# Patient Record
Sex: Female | Born: 1935 | Race: White | Hispanic: No | Marital: Single
Health system: Southern US, Community
[De-identification: ages and names within clinical notes are randomized; demographics above are authoritative.]

---

## 2004-09-25 ENCOUNTER — Ambulatory Visit: Payer: Self-pay | Admitting: Unknown Physician Specialty

## 2005-04-02 ENCOUNTER — Ambulatory Visit: Payer: Self-pay | Admitting: Unknown Physician Specialty

## 2005-09-29 ENCOUNTER — Ambulatory Visit: Payer: Self-pay | Admitting: Unknown Physician Specialty

## 2005-10-07 ENCOUNTER — Ambulatory Visit: Payer: Self-pay | Admitting: Unknown Physician Specialty

## 2006-10-13 ENCOUNTER — Ambulatory Visit: Payer: Self-pay | Admitting: Unknown Physician Specialty

## 2006-10-19 ENCOUNTER — Ambulatory Visit: Payer: Self-pay | Admitting: Unknown Physician Specialty

## 2007-04-26 ENCOUNTER — Ambulatory Visit: Payer: Self-pay | Admitting: Unknown Physician Specialty

## 2007-10-14 ENCOUNTER — Ambulatory Visit: Payer: Self-pay | Admitting: Surgery

## 2008-01-04 ENCOUNTER — Ambulatory Visit: Payer: Self-pay | Admitting: Unknown Physician Specialty

## 2008-01-12 ENCOUNTER — Ambulatory Visit: Payer: Self-pay | Admitting: Surgery

## 2008-01-18 ENCOUNTER — Ambulatory Visit: Payer: Self-pay | Admitting: Surgery

## 2008-10-17 ENCOUNTER — Ambulatory Visit: Payer: Self-pay | Admitting: Unknown Physician Specialty

## 2009-10-22 ENCOUNTER — Ambulatory Visit: Payer: Self-pay | Admitting: Unknown Physician Specialty

## 2010-10-24 ENCOUNTER — Ambulatory Visit: Payer: Self-pay | Admitting: Unknown Physician Specialty

## 2010-11-05 ENCOUNTER — Ambulatory Visit: Payer: Self-pay | Admitting: Internal Medicine

## 2011-10-30 ENCOUNTER — Ambulatory Visit: Payer: Self-pay | Admitting: Unknown Physician Specialty

## 2012-11-01 ENCOUNTER — Ambulatory Visit: Payer: Self-pay | Admitting: Unknown Physician Specialty

## 2013-03-31 ENCOUNTER — Ambulatory Visit: Payer: Self-pay | Admitting: Orthopedic Surgery

## 2013-04-05 ENCOUNTER — Ambulatory Visit: Payer: Self-pay | Admitting: Orthopedic Surgery

## 2013-04-05 LAB — BASIC METABOLIC PANEL
Calcium, Total: 9.5 mg/dL (ref 8.5–10.1)
Creatinine: 0.92 mg/dL (ref 0.60–1.30)
EGFR (Non-African Amer.): 58 — ABNORMAL LOW
Glucose: 150 mg/dL — ABNORMAL HIGH (ref 65–99)
Sodium: 131 mmol/L — ABNORMAL LOW (ref 136–145)

## 2013-04-05 LAB — HEMOGLOBIN: HGB: 13.3 g/dL

## 2013-04-07 ENCOUNTER — Ambulatory Visit: Payer: Self-pay | Admitting: Orthopedic Surgery

## 2013-11-02 ENCOUNTER — Ambulatory Visit: Payer: Self-pay | Admitting: Internal Medicine

## 2014-10-13 ENCOUNTER — Other Ambulatory Visit: Payer: Self-pay | Admitting: Internal Medicine

## 2014-10-13 DIAGNOSIS — Z1231 Encounter for screening mammogram for malignant neoplasm of breast: Secondary | ICD-10-CM

## 2014-10-20 NOTE — Op Note (Signed)
PATIENT NAME:  Ileana RoupCOBB, Shima A MR#:  [id number] DATE OF BIRTH:  [birth year]  DATE OF PROCEDURE:  04/07/2013  PREOPERATIVE DIAGNOSIS: L1 compression fracture.   POSTOPERATIVE DIAGNOSIS: L1 compression fracture.   PROCEDURE: L1 biopsy and kyphoplasty.   ANESTHESIA: MAC.   SURGEON: Kennedy BuckerMichael Herschell Virani, M.D.   DESCRIPTION OF PROCEDURE: The patient was brought to the operating room and after adequate anesthesia was obtained the patient was placed prone. C-arm was brought in and good visualization of the L1 vertebral body was obtained in both AP and lateral projections. After time out procedure was completed. The skin was prepped with alcohol and 5 mL of 1% Xylocaine was infiltrated on either side subcutaneously for initial analgesia for this subsequent procedure. The back was then prepped and draped in the usual sterile fashion and a repeat timeout procedure completed. Spinal needle was then inserted through the previously anesthetized with local anesthetic down to the pedicle and a combination of 0.5% Sensorcaine with epinephrine and 1% Xylocaine was infiltrated along the tract with 20 mL given to both sides, a small stab incision was then made on the right side and a trocar advanced into the middle of the body.  It  was across the midline and so only one side needed to be addressed. A biopsy was obtained at this time. Drilling was then carried out and a good correction of the deformity was obtained with approximately 5 mL inflation of a balloon. The cement was then mixed and approximately 5 mL inserted. This gave very good fill to the vertebral body and significant correction of the preop deformity. The trocar was removed and permanent C-arm views were obtained. Dermabond was used for the skin incision followed by a Band-Aid. The patient was sent to the recovery room in stable condition and this.   ESTIMATED BLOOD LOSS: Minimal.   COMPLICATIONS: None.   SPECIMEN: L1 vertebral body biopsy.    ____________________________ Leitha SchullerMichael J. Millee Denise, MD mjm:cc D: 04/07/2013 18:56:58 ET T: 04/07/2013 22:36:35 ET JOB#: [id number]  cc: Leitha SchullerMichael J. Shella Lahman, MD, <Dictator> Leitha SchullerMICHAEL J Reeva Davern MD ELECTRONICALLY SIGNED 04/08/2013 7:59

## 2014-11-07 ENCOUNTER — Ambulatory Visit
Admission: RE | Admit: 2014-11-07 | Discharge: 2014-11-07 | Disposition: A | Discharge status: Home / Self Care | Payer: Commercial Managed Care - HMO | Source: Ambulatory Visit | Attending: Internal Medicine | Admitting: Internal Medicine

## 2014-11-07 ENCOUNTER — Other Ambulatory Visit: Payer: Self-pay | Admitting: Internal Medicine

## 2014-11-07 DIAGNOSIS — Z1231 Encounter for screening mammogram for malignant neoplasm of breast: Secondary | ICD-10-CM | POA: Insufficient documentation

## 2015-10-01 DIAGNOSIS — I10 Essential (primary) hypertension: Secondary | ICD-10-CM | POA: Diagnosis not present

## 2015-10-01 DIAGNOSIS — E782 Mixed hyperlipidemia: Secondary | ICD-10-CM | POA: Diagnosis not present

## 2015-10-01 DIAGNOSIS — E1129 Type 2 diabetes mellitus with other diabetic kidney complication: Secondary | ICD-10-CM | POA: Diagnosis not present

## 2015-10-01 DIAGNOSIS — R809 Proteinuria, unspecified: Secondary | ICD-10-CM | POA: Diagnosis not present

## 2015-10-05 ENCOUNTER — Other Ambulatory Visit: Payer: Self-pay | Admitting: Internal Medicine

## 2015-10-05 DIAGNOSIS — Z1231 Encounter for screening mammogram for malignant neoplasm of breast: Secondary | ICD-10-CM

## 2015-10-08 DIAGNOSIS — R300 Dysuria: Secondary | ICD-10-CM | POA: Diagnosis not present

## 2015-10-08 DIAGNOSIS — E1129 Type 2 diabetes mellitus with other diabetic kidney complication: Secondary | ICD-10-CM | POA: Diagnosis not present

## 2015-10-08 DIAGNOSIS — Z1239 Encounter for other screening for malignant neoplasm of breast: Secondary | ICD-10-CM | POA: Diagnosis not present

## 2015-10-08 DIAGNOSIS — R809 Proteinuria, unspecified: Secondary | ICD-10-CM | POA: Diagnosis not present

## 2015-10-08 DIAGNOSIS — N760 Acute vaginitis: Secondary | ICD-10-CM | POA: Diagnosis not present

## 2015-10-08 DIAGNOSIS — Z0000 Encounter for general adult medical examination without abnormal findings: Secondary | ICD-10-CM | POA: Diagnosis not present

## 2015-11-08 ENCOUNTER — Other Ambulatory Visit: Payer: Self-pay | Admitting: Internal Medicine

## 2015-11-08 ENCOUNTER — Ambulatory Visit
Admission: RE | Admit: 2015-11-08 | Discharge: 2015-11-08 | Disposition: A | Discharge status: Home / Self Care | Payer: PPO | Source: Ambulatory Visit | Attending: Internal Medicine | Admitting: Internal Medicine

## 2015-11-08 DIAGNOSIS — Z1231 Encounter for screening mammogram for malignant neoplasm of breast: Secondary | ICD-10-CM

## 2016-01-02 DIAGNOSIS — R809 Proteinuria, unspecified: Secondary | ICD-10-CM | POA: Diagnosis not present

## 2016-01-02 DIAGNOSIS — E1129 Type 2 diabetes mellitus with other diabetic kidney complication: Secondary | ICD-10-CM | POA: Diagnosis not present

## 2016-01-09 DIAGNOSIS — I10 Essential (primary) hypertension: Secondary | ICD-10-CM | POA: Diagnosis not present

## 2016-01-09 DIAGNOSIS — Z8639 Personal history of other endocrine, nutritional and metabolic disease: Secondary | ICD-10-CM | POA: Diagnosis not present

## 2016-01-09 DIAGNOSIS — E782 Mixed hyperlipidemia: Secondary | ICD-10-CM | POA: Diagnosis not present

## 2016-01-09 DIAGNOSIS — K219 Gastro-esophageal reflux disease without esophagitis: Secondary | ICD-10-CM | POA: Diagnosis not present

## 2016-01-09 DIAGNOSIS — R809 Proteinuria, unspecified: Secondary | ICD-10-CM | POA: Diagnosis not present

## 2016-01-09 DIAGNOSIS — E1129 Type 2 diabetes mellitus with other diabetic kidney complication: Secondary | ICD-10-CM | POA: Diagnosis not present

## 2016-01-16 DIAGNOSIS — E119 Type 2 diabetes mellitus without complications: Secondary | ICD-10-CM | POA: Diagnosis not present

## 2016-07-07 DIAGNOSIS — E782 Mixed hyperlipidemia: Secondary | ICD-10-CM | POA: Diagnosis not present

## 2016-07-07 DIAGNOSIS — Z8639 Personal history of other endocrine, nutritional and metabolic disease: Secondary | ICD-10-CM | POA: Diagnosis not present

## 2016-07-07 DIAGNOSIS — R809 Proteinuria, unspecified: Secondary | ICD-10-CM | POA: Diagnosis not present

## 2016-07-07 DIAGNOSIS — E1129 Type 2 diabetes mellitus with other diabetic kidney complication: Secondary | ICD-10-CM | POA: Diagnosis not present

## 2016-07-11 DIAGNOSIS — R809 Proteinuria, unspecified: Secondary | ICD-10-CM | POA: Diagnosis not present

## 2016-07-11 DIAGNOSIS — B351 Tinea unguium: Secondary | ICD-10-CM | POA: Diagnosis not present

## 2016-07-11 DIAGNOSIS — E7800 Pure hypercholesterolemia, unspecified: Secondary | ICD-10-CM | POA: Diagnosis not present

## 2016-07-11 DIAGNOSIS — I10 Essential (primary) hypertension: Secondary | ICD-10-CM | POA: Diagnosis not present

## 2016-07-11 DIAGNOSIS — E1129 Type 2 diabetes mellitus with other diabetic kidney complication: Secondary | ICD-10-CM | POA: Diagnosis not present

## 2016-10-06 ENCOUNTER — Other Ambulatory Visit: Payer: Self-pay | Admitting: Internal Medicine

## 2016-10-06 DIAGNOSIS — Z1231 Encounter for screening mammogram for malignant neoplasm of breast: Secondary | ICD-10-CM

## 2016-10-09 DIAGNOSIS — I10 Essential (primary) hypertension: Secondary | ICD-10-CM | POA: Diagnosis not present

## 2016-10-09 DIAGNOSIS — Z23 Encounter for immunization: Secondary | ICD-10-CM | POA: Diagnosis not present

## 2016-10-09 DIAGNOSIS — Z0000 Encounter for general adult medical examination without abnormal findings: Secondary | ICD-10-CM | POA: Diagnosis not present

## 2016-10-09 DIAGNOSIS — Z780 Asymptomatic menopausal state: Secondary | ICD-10-CM | POA: Diagnosis not present

## 2016-10-09 DIAGNOSIS — A084 Viral intestinal infection, unspecified: Secondary | ICD-10-CM | POA: Diagnosis not present

## 2016-10-09 DIAGNOSIS — J301 Allergic rhinitis due to pollen: Secondary | ICD-10-CM | POA: Diagnosis not present

## 2016-10-17 DIAGNOSIS — M8588 Other specified disorders of bone density and structure, other site: Secondary | ICD-10-CM | POA: Diagnosis not present

## 2016-11-10 ENCOUNTER — Ambulatory Visit
Admission: RE | Admit: 2016-11-10 | Discharge: 2016-11-10 | Disposition: A | Discharge status: Home / Self Care | Payer: PPO | Source: Ambulatory Visit | Attending: Internal Medicine | Admitting: Internal Medicine

## 2016-11-10 DIAGNOSIS — Z1231 Encounter for screening mammogram for malignant neoplasm of breast: Secondary | ICD-10-CM

## 2017-01-07 DIAGNOSIS — I10 Essential (primary) hypertension: Secondary | ICD-10-CM | POA: Diagnosis not present

## 2017-01-14 DIAGNOSIS — G4762 Sleep related leg cramps: Secondary | ICD-10-CM | POA: Diagnosis not present

## 2017-01-14 DIAGNOSIS — R350 Frequency of micturition: Secondary | ICD-10-CM | POA: Diagnosis not present

## 2017-01-14 DIAGNOSIS — M25472 Effusion, left ankle: Secondary | ICD-10-CM | POA: Diagnosis not present

## 2017-01-14 DIAGNOSIS — E1129 Type 2 diabetes mellitus with other diabetic kidney complication: Secondary | ICD-10-CM | POA: Diagnosis not present

## 2017-01-14 DIAGNOSIS — R809 Proteinuria, unspecified: Secondary | ICD-10-CM | POA: Diagnosis not present

## 2017-01-14 DIAGNOSIS — I10 Essential (primary) hypertension: Secondary | ICD-10-CM | POA: Diagnosis not present

## 2017-01-14 DIAGNOSIS — M25471 Effusion, right ankle: Secondary | ICD-10-CM | POA: Diagnosis not present

## 2017-04-29 DIAGNOSIS — K219 Gastro-esophageal reflux disease without esophagitis: Secondary | ICD-10-CM | POA: Diagnosis not present

## 2017-04-29 DIAGNOSIS — R809 Proteinuria, unspecified: Secondary | ICD-10-CM | POA: Diagnosis not present

## 2017-04-29 DIAGNOSIS — E1129 Type 2 diabetes mellitus with other diabetic kidney complication: Secondary | ICD-10-CM | POA: Diagnosis not present

## 2017-04-29 DIAGNOSIS — I10 Essential (primary) hypertension: Secondary | ICD-10-CM | POA: Diagnosis not present

## 2017-07-24 DIAGNOSIS — R809 Proteinuria, unspecified: Secondary | ICD-10-CM | POA: Diagnosis not present

## 2017-07-24 DIAGNOSIS — I10 Essential (primary) hypertension: Secondary | ICD-10-CM | POA: Diagnosis not present

## 2017-07-24 DIAGNOSIS — E1129 Type 2 diabetes mellitus with other diabetic kidney complication: Secondary | ICD-10-CM | POA: Diagnosis not present

## 2017-07-30 DIAGNOSIS — E1122 Type 2 diabetes mellitus with diabetic chronic kidney disease: Secondary | ICD-10-CM | POA: Diagnosis not present

## 2017-07-30 DIAGNOSIS — N3000 Acute cystitis without hematuria: Secondary | ICD-10-CM | POA: Diagnosis not present

## 2017-07-30 DIAGNOSIS — N183 Chronic kidney disease, stage 3 (moderate): Secondary | ICD-10-CM | POA: Diagnosis not present

## 2017-07-30 DIAGNOSIS — I10 Essential (primary) hypertension: Secondary | ICD-10-CM | POA: Diagnosis not present

## 2017-10-05 DIAGNOSIS — S93402A Sprain of unspecified ligament of left ankle, initial encounter: Secondary | ICD-10-CM | POA: Diagnosis not present

## 2017-10-05 DIAGNOSIS — J301 Allergic rhinitis due to pollen: Secondary | ICD-10-CM | POA: Diagnosis not present

## 2017-10-08 DIAGNOSIS — M25572 Pain in left ankle and joints of left foot: Secondary | ICD-10-CM | POA: Diagnosis not present

## 2017-10-08 DIAGNOSIS — S8012XA Contusion of left lower leg, initial encounter: Secondary | ICD-10-CM | POA: Diagnosis not present

## 2017-10-08 DIAGNOSIS — E1140 Type 2 diabetes mellitus with diabetic neuropathy, unspecified: Secondary | ICD-10-CM | POA: Diagnosis not present

## 2017-10-08 DIAGNOSIS — B351 Tinea unguium: Secondary | ICD-10-CM | POA: Diagnosis not present

## 2017-10-08 DIAGNOSIS — L851 Acquired keratosis [keratoderma] palmaris et plantaris: Secondary | ICD-10-CM | POA: Diagnosis not present

## 2017-10-12 ENCOUNTER — Other Ambulatory Visit: Payer: Self-pay | Admitting: Internal Medicine

## 2017-10-12 DIAGNOSIS — Z1231 Encounter for screening mammogram for malignant neoplasm of breast: Secondary | ICD-10-CM

## 2017-10-20 DIAGNOSIS — E1122 Type 2 diabetes mellitus with diabetic chronic kidney disease: Secondary | ICD-10-CM | POA: Diagnosis not present

## 2017-10-20 DIAGNOSIS — N183 Chronic kidney disease, stage 3 (moderate): Secondary | ICD-10-CM | POA: Diagnosis not present

## 2017-10-26 DIAGNOSIS — E875 Hyperkalemia: Secondary | ICD-10-CM | POA: Diagnosis not present

## 2017-10-26 DIAGNOSIS — N183 Chronic kidney disease, stage 3 (moderate): Secondary | ICD-10-CM | POA: Diagnosis not present

## 2017-10-26 DIAGNOSIS — Z0000 Encounter for general adult medical examination without abnormal findings: Secondary | ICD-10-CM | POA: Diagnosis not present

## 2017-10-26 DIAGNOSIS — E1122 Type 2 diabetes mellitus with diabetic chronic kidney disease: Secondary | ICD-10-CM | POA: Diagnosis not present

## 2017-12-11 DIAGNOSIS — E875 Hyperkalemia: Secondary | ICD-10-CM | POA: Diagnosis not present

## 2018-01-07 ENCOUNTER — Ambulatory Visit
Admission: RE | Admit: 2018-01-07 | Discharge: 2018-01-07 | Disposition: A | Discharge status: Home / Self Care | Payer: PPO | Source: Ambulatory Visit | Attending: Internal Medicine | Admitting: Internal Medicine

## 2018-01-07 DIAGNOSIS — Z1231 Encounter for screening mammogram for malignant neoplasm of breast: Secondary | ICD-10-CM | POA: Insufficient documentation

## 2018-01-11 ENCOUNTER — Other Ambulatory Visit: Payer: Self-pay | Admitting: Internal Medicine

## 2018-01-11 DIAGNOSIS — R928 Other abnormal and inconclusive findings on diagnostic imaging of breast: Secondary | ICD-10-CM

## 2018-01-11 DIAGNOSIS — N6489 Other specified disorders of breast: Secondary | ICD-10-CM

## 2018-02-01 ENCOUNTER — Ambulatory Visit
Admission: RE | Admit: 2018-02-01 | Discharge: 2018-02-01 | Disposition: A | Discharge status: Home / Self Care | Payer: PPO | Source: Ambulatory Visit | Attending: Internal Medicine | Admitting: Internal Medicine

## 2018-02-01 DIAGNOSIS — R922 Inconclusive mammogram: Secondary | ICD-10-CM | POA: Diagnosis not present

## 2018-02-01 DIAGNOSIS — R928 Other abnormal and inconclusive findings on diagnostic imaging of breast: Secondary | ICD-10-CM | POA: Insufficient documentation

## 2018-02-01 DIAGNOSIS — N6489 Other specified disorders of breast: Secondary | ICD-10-CM

## 2018-03-09 DIAGNOSIS — H2511 Age-related nuclear cataract, right eye: Secondary | ICD-10-CM | POA: Diagnosis not present

## 2018-04-20 DIAGNOSIS — I10 Essential (primary) hypertension: Secondary | ICD-10-CM | POA: Diagnosis not present

## 2018-04-20 DIAGNOSIS — E1121 Type 2 diabetes mellitus with diabetic nephropathy: Secondary | ICD-10-CM | POA: Diagnosis not present

## 2018-04-27 DIAGNOSIS — I10 Essential (primary) hypertension: Secondary | ICD-10-CM | POA: Diagnosis not present

## 2018-04-27 DIAGNOSIS — E1122 Type 2 diabetes mellitus with diabetic chronic kidney disease: Secondary | ICD-10-CM | POA: Diagnosis not present

## 2018-04-27 DIAGNOSIS — N183 Chronic kidney disease, stage 3 (moderate): Secondary | ICD-10-CM | POA: Diagnosis not present

## 2018-04-27 DIAGNOSIS — E7800 Pure hypercholesterolemia, unspecified: Secondary | ICD-10-CM | POA: Diagnosis not present

## 2018-07-22 DIAGNOSIS — E1122 Type 2 diabetes mellitus with diabetic chronic kidney disease: Secondary | ICD-10-CM | POA: Diagnosis not present

## 2018-07-22 DIAGNOSIS — N183 Chronic kidney disease, stage 3 (moderate): Secondary | ICD-10-CM | POA: Diagnosis not present

## 2018-07-29 DIAGNOSIS — N183 Chronic kidney disease, stage 3 (moderate): Secondary | ICD-10-CM | POA: Diagnosis not present

## 2018-07-29 DIAGNOSIS — I10 Essential (primary) hypertension: Secondary | ICD-10-CM | POA: Diagnosis not present

## 2018-07-29 DIAGNOSIS — E1122 Type 2 diabetes mellitus with diabetic chronic kidney disease: Secondary | ICD-10-CM | POA: Diagnosis not present

## 2018-07-29 DIAGNOSIS — E875 Hyperkalemia: Secondary | ICD-10-CM | POA: Diagnosis not present

## 2018-09-08 DIAGNOSIS — I10 Essential (primary) hypertension: Secondary | ICD-10-CM | POA: Diagnosis not present

## 2018-09-08 DIAGNOSIS — E875 Hyperkalemia: Secondary | ICD-10-CM | POA: Diagnosis not present

## 2018-10-25 DIAGNOSIS — R399 Unspecified symptoms and signs involving the genitourinary system: Secondary | ICD-10-CM | POA: Diagnosis not present

## 2018-10-25 DIAGNOSIS — R3915 Urgency of urination: Secondary | ICD-10-CM | POA: Diagnosis not present

## 2018-11-01 DIAGNOSIS — I10 Essential (primary) hypertension: Secondary | ICD-10-CM | POA: Diagnosis not present

## 2018-11-09 DIAGNOSIS — N3000 Acute cystitis without hematuria: Secondary | ICD-10-CM | POA: Diagnosis not present

## 2018-11-09 DIAGNOSIS — N183 Chronic kidney disease, stage 3 (moderate): Secondary | ICD-10-CM | POA: Diagnosis not present

## 2018-11-09 DIAGNOSIS — K219 Gastro-esophageal reflux disease without esophagitis: Secondary | ICD-10-CM | POA: Diagnosis not present

## 2018-11-09 DIAGNOSIS — E1122 Type 2 diabetes mellitus with diabetic chronic kidney disease: Secondary | ICD-10-CM | POA: Diagnosis not present

## 2018-11-09 DIAGNOSIS — M791 Myalgia, unspecified site: Secondary | ICD-10-CM | POA: Diagnosis not present

## 2018-11-16 DIAGNOSIS — R790 Abnormal level of blood mineral: Secondary | ICD-10-CM | POA: Diagnosis not present

## 2018-11-16 DIAGNOSIS — M791 Myalgia, unspecified site: Secondary | ICD-10-CM | POA: Diagnosis not present

## 2018-11-23 DIAGNOSIS — K219 Gastro-esophageal reflux disease without esophagitis: Secondary | ICD-10-CM | POA: Diagnosis not present

## 2018-11-23 DIAGNOSIS — N183 Chronic kidney disease, stage 3 (moderate): Secondary | ICD-10-CM | POA: Diagnosis not present

## 2018-11-23 DIAGNOSIS — R790 Abnormal level of blood mineral: Secondary | ICD-10-CM | POA: Diagnosis not present

## 2018-11-23 DIAGNOSIS — E1122 Type 2 diabetes mellitus with diabetic chronic kidney disease: Secondary | ICD-10-CM | POA: Diagnosis not present

## 2018-11-23 DIAGNOSIS — M791 Myalgia, unspecified site: Secondary | ICD-10-CM | POA: Diagnosis not present

## 2018-11-23 DIAGNOSIS — I10 Essential (primary) hypertension: Secondary | ICD-10-CM | POA: Diagnosis not present

## 2018-11-30 DIAGNOSIS — R790 Abnormal level of blood mineral: Secondary | ICD-10-CM | POA: Diagnosis not present

## 2018-12-14 DIAGNOSIS — M25512 Pain in left shoulder: Secondary | ICD-10-CM | POA: Diagnosis not present

## 2018-12-14 DIAGNOSIS — M19011 Primary osteoarthritis, right shoulder: Secondary | ICD-10-CM | POA: Diagnosis not present

## 2018-12-14 DIAGNOSIS — M19012 Primary osteoarthritis, left shoulder: Secondary | ICD-10-CM | POA: Diagnosis not present

## 2018-12-14 DIAGNOSIS — M542 Cervicalgia: Secondary | ICD-10-CM | POA: Diagnosis not present

## 2018-12-14 DIAGNOSIS — M791 Myalgia, unspecified site: Secondary | ICD-10-CM | POA: Diagnosis not present

## 2018-12-14 DIAGNOSIS — E612 Magnesium deficiency: Secondary | ICD-10-CM | POA: Diagnosis not present

## 2018-12-14 DIAGNOSIS — M4312 Spondylolisthesis, cervical region: Secondary | ICD-10-CM | POA: Diagnosis not present

## 2018-12-14 DIAGNOSIS — M25511 Pain in right shoulder: Secondary | ICD-10-CM | POA: Diagnosis not present

## 2018-12-16 DIAGNOSIS — E875 Hyperkalemia: Secondary | ICD-10-CM | POA: Diagnosis not present

## 2018-12-20 DIAGNOSIS — M353 Polymyalgia rheumatica: Secondary | ICD-10-CM | POA: Diagnosis not present

## 2018-12-20 DIAGNOSIS — Z7952 Long term (current) use of systemic steroids: Secondary | ICD-10-CM | POA: Diagnosis not present

## 2019-01-11 DIAGNOSIS — I10 Essential (primary) hypertension: Secondary | ICD-10-CM | POA: Diagnosis not present

## 2019-01-11 DIAGNOSIS — R600 Localized edema: Secondary | ICD-10-CM | POA: Diagnosis not present

## 2019-01-11 DIAGNOSIS — R0602 Shortness of breath: Secondary | ICD-10-CM | POA: Diagnosis not present

## 2019-01-17 DIAGNOSIS — Z8679 Personal history of other diseases of the circulatory system: Secondary | ICD-10-CM | POA: Diagnosis not present

## 2019-01-17 DIAGNOSIS — I10 Essential (primary) hypertension: Secondary | ICD-10-CM | POA: Diagnosis not present

## 2019-01-17 DIAGNOSIS — E785 Hyperlipidemia, unspecified: Secondary | ICD-10-CM | POA: Diagnosis not present

## 2019-01-17 DIAGNOSIS — R600 Localized edema: Secondary | ICD-10-CM | POA: Diagnosis not present

## 2019-01-18 DIAGNOSIS — M353 Polymyalgia rheumatica: Secondary | ICD-10-CM | POA: Diagnosis not present

## 2019-01-18 DIAGNOSIS — Z7952 Long term (current) use of systemic steroids: Secondary | ICD-10-CM | POA: Diagnosis not present

## 2019-02-04 DIAGNOSIS — Z1331 Encounter for screening for depression: Secondary | ICD-10-CM | POA: Diagnosis not present

## 2019-02-04 DIAGNOSIS — Z0000 Encounter for general adult medical examination without abnormal findings: Secondary | ICD-10-CM | POA: Diagnosis not present

## 2019-02-14 DIAGNOSIS — Z7952 Long term (current) use of systemic steroids: Secondary | ICD-10-CM | POA: Diagnosis not present

## 2019-02-14 DIAGNOSIS — E1122 Type 2 diabetes mellitus with diabetic chronic kidney disease: Secondary | ICD-10-CM | POA: Diagnosis not present

## 2019-02-14 DIAGNOSIS — Z79899 Other long term (current) drug therapy: Secondary | ICD-10-CM | POA: Diagnosis not present

## 2019-02-14 DIAGNOSIS — N183 Chronic kidney disease, stage 3 (moderate): Secondary | ICD-10-CM | POA: Diagnosis not present

## 2019-02-14 DIAGNOSIS — M353 Polymyalgia rheumatica: Secondary | ICD-10-CM | POA: Diagnosis not present

## 2019-03-02 DIAGNOSIS — N183 Chronic kidney disease, stage 3 (moderate): Secondary | ICD-10-CM | POA: Diagnosis not present

## 2019-03-02 DIAGNOSIS — Z79899 Other long term (current) drug therapy: Secondary | ICD-10-CM | POA: Diagnosis not present

## 2019-03-02 DIAGNOSIS — E1122 Type 2 diabetes mellitus with diabetic chronic kidney disease: Secondary | ICD-10-CM | POA: Diagnosis not present

## 2019-03-02 DIAGNOSIS — I10 Essential (primary) hypertension: Secondary | ICD-10-CM | POA: Diagnosis not present

## 2019-03-22 DIAGNOSIS — Z7952 Long term (current) use of systemic steroids: Secondary | ICD-10-CM | POA: Diagnosis not present

## 2019-03-22 DIAGNOSIS — M353 Polymyalgia rheumatica: Secondary | ICD-10-CM | POA: Diagnosis not present

## 2019-04-21 DIAGNOSIS — Z7952 Long term (current) use of systemic steroids: Secondary | ICD-10-CM | POA: Diagnosis not present

## 2019-04-21 DIAGNOSIS — M353 Polymyalgia rheumatica: Secondary | ICD-10-CM | POA: Diagnosis not present

## 2019-05-20 DIAGNOSIS — M353 Polymyalgia rheumatica: Secondary | ICD-10-CM | POA: Diagnosis not present

## 2019-05-20 DIAGNOSIS — Z7952 Long term (current) use of systemic steroids: Secondary | ICD-10-CM | POA: Diagnosis not present

## 2019-06-27 DIAGNOSIS — M353 Polymyalgia rheumatica: Secondary | ICD-10-CM | POA: Diagnosis not present

## 2019-06-27 DIAGNOSIS — Z7952 Long term (current) use of systemic steroids: Secondary | ICD-10-CM | POA: Diagnosis not present

## 2019-06-27 DIAGNOSIS — N183 Chronic kidney disease, stage 3 unspecified: Secondary | ICD-10-CM | POA: Diagnosis not present

## 2019-06-27 DIAGNOSIS — E1122 Type 2 diabetes mellitus with diabetic chronic kidney disease: Secondary | ICD-10-CM | POA: Diagnosis not present

## 2019-07-04 DIAGNOSIS — I10 Essential (primary) hypertension: Secondary | ICD-10-CM | POA: Diagnosis not present

## 2019-07-04 DIAGNOSIS — Z79899 Other long term (current) drug therapy: Secondary | ICD-10-CM | POA: Diagnosis not present

## 2019-07-04 DIAGNOSIS — E7800 Pure hypercholesterolemia, unspecified: Secondary | ICD-10-CM | POA: Diagnosis not present

## 2019-07-04 DIAGNOSIS — E1121 Type 2 diabetes mellitus with diabetic nephropathy: Secondary | ICD-10-CM | POA: Diagnosis not present

## 2019-07-04 DIAGNOSIS — M353 Polymyalgia rheumatica: Secondary | ICD-10-CM | POA: Diagnosis not present

## 2019-07-20 DIAGNOSIS — Z7952 Long term (current) use of systemic steroids: Secondary | ICD-10-CM | POA: Diagnosis not present

## 2019-07-20 DIAGNOSIS — M353 Polymyalgia rheumatica: Secondary | ICD-10-CM | POA: Diagnosis not present

## 2019-08-08 DIAGNOSIS — R42 Dizziness and giddiness: Secondary | ICD-10-CM | POA: Diagnosis not present

## 2019-08-08 DIAGNOSIS — J0100 Acute maxillary sinusitis, unspecified: Secondary | ICD-10-CM | POA: Diagnosis not present

## 2019-08-17 DIAGNOSIS — Z7952 Long term (current) use of systemic steroids: Secondary | ICD-10-CM | POA: Diagnosis not present

## 2019-08-17 DIAGNOSIS — M353 Polymyalgia rheumatica: Secondary | ICD-10-CM | POA: Diagnosis not present

## 2019-09-19 DIAGNOSIS — M353 Polymyalgia rheumatica: Secondary | ICD-10-CM | POA: Diagnosis not present

## 2019-09-19 DIAGNOSIS — Z7952 Long term (current) use of systemic steroids: Secondary | ICD-10-CM | POA: Diagnosis not present

## 2019-10-20 DIAGNOSIS — I10 Essential (primary) hypertension: Secondary | ICD-10-CM | POA: Diagnosis not present

## 2019-10-20 DIAGNOSIS — N1831 Chronic kidney disease, stage 3a: Secondary | ICD-10-CM | POA: Diagnosis not present

## 2019-10-20 DIAGNOSIS — Z7952 Long term (current) use of systemic steroids: Secondary | ICD-10-CM | POA: Diagnosis not present

## 2019-10-20 DIAGNOSIS — E1121 Type 2 diabetes mellitus with diabetic nephropathy: Secondary | ICD-10-CM | POA: Diagnosis not present

## 2019-10-20 DIAGNOSIS — Z79899 Other long term (current) drug therapy: Secondary | ICD-10-CM | POA: Diagnosis not present

## 2019-10-20 DIAGNOSIS — M353 Polymyalgia rheumatica: Secondary | ICD-10-CM | POA: Diagnosis not present

## 2019-10-20 DIAGNOSIS — J302 Other seasonal allergic rhinitis: Secondary | ICD-10-CM | POA: Diagnosis not present

## 2019-10-20 DIAGNOSIS — R809 Proteinuria, unspecified: Secondary | ICD-10-CM | POA: Diagnosis not present

## 2019-10-20 DIAGNOSIS — K219 Gastro-esophageal reflux disease without esophagitis: Secondary | ICD-10-CM | POA: Diagnosis not present

## 2019-10-20 DIAGNOSIS — E1129 Type 2 diabetes mellitus with other diabetic kidney complication: Secondary | ICD-10-CM | POA: Diagnosis not present

## 2019-11-29 DEATH — deceased
